# Patient Record
Sex: Female | Born: 1980 | Race: Black or African American | Hispanic: No | Marital: Single | Smoking: Never smoker
Health system: Southern US, Community
[De-identification: ages and names within clinical notes are randomized; demographics above are authoritative.]

## PROBLEM LIST (undated history)

## (undated) DIAGNOSIS — F32A Depression, unspecified: Secondary | ICD-10-CM

## (undated) DIAGNOSIS — F419 Anxiety disorder, unspecified: Secondary | ICD-10-CM

## (undated) DIAGNOSIS — I10 Essential (primary) hypertension: Secondary | ICD-10-CM

## (undated) DIAGNOSIS — F4310 Post-traumatic stress disorder, unspecified: Secondary | ICD-10-CM

## (undated) DIAGNOSIS — G4730 Sleep apnea, unspecified: Secondary | ICD-10-CM

---

## 2020-04-23 ENCOUNTER — Ambulatory Visit
Admission: EM | Admit: 2020-04-23 | Discharge: 2020-04-23 | Disposition: A | Discharge status: Home / Self Care | Payer: Medicaid Other

## 2020-04-23 ENCOUNTER — Other Ambulatory Visit: Payer: Self-pay

## 2020-04-23 DIAGNOSIS — J0110 Acute frontal sinusitis, unspecified: Secondary | ICD-10-CM | POA: Diagnosis not present

## 2020-04-23 DIAGNOSIS — Z1152 Encounter for screening for COVID-19: Secondary | ICD-10-CM

## 2020-04-23 HISTORY — DX: Post-traumatic stress disorder, unspecified: F43.10

## 2020-04-23 HISTORY — DX: Anxiety disorder, unspecified: F41.9

## 2020-04-23 HISTORY — DX: Essential (primary) hypertension: I10

## 2020-04-23 HISTORY — DX: Depression, unspecified: F32.A

## 2020-04-23 HISTORY — DX: Sleep apnea, unspecified: G47.30

## 2020-04-23 MED ORDER — CETIRIZINE HCL 10 MG PO TABS: 10.0000 mg | ORAL_TABLET | Freq: Every day | ORAL | 0 refills | Status: AC

## 2020-04-23 MED ORDER — FLUTICASONE PROPIONATE 50 MCG/ACT NA SUSP: 2.0000 | Freq: Every day | NASAL | 2 refills | Status: DC

## 2020-04-23 NOTE — ED Provider Notes (Signed)
Pamela Sullivan    CSN: [id number] Arrival date & time: 04/23/20  0836      History   Chief Complaint Chief Complaint  Patient presents with  . Facial Pain    HPI Pamela Sullivan is a 40 y.o. female.   Patient is a 40 year old female that presents today with complaints of facial pressure, facial swelling, nasal congestion, left ear itching.  This is been present for the past 3 days.  Took one dose of Claritin yesterday.  No cough, chest congestion, fever, chills.  No specific concerns for Covid.  History of allergies.     Past Medical History:  Diagnosis Date  . Anxiety   . Depression   . Hypertension   . PTSD (post-traumatic stress disorder)   . Sleep apnea     There are no problems to display for this patient.   Past Surgical History:  Procedure Laterality Date  . CESAREAN SECTION      OB History    Gravida  1   Para      Term      Preterm      AB      Living        SAB      IAB      Ectopic      Multiple      Live Births               Home Medications    Prior to Admission medications   Medication Sig Start Date End Date Taking? Authorizing Provider  albuterol (VENTOLIN HFA) 108 (90 Base) MCG/ACT inhaler Inhale into the lungs. 08/17/19  Yes [provider]  amLODipine (NORVASC) 5 MG tablet Take by mouth. 09/30/19  Yes [provider]  benzonatate (TESSALON) 200 MG capsule Take by mouth. 09/30/19  Yes [provider]  buPROPion (WELLBUTRIN XL) 150 MG 24 hr tablet Take 1 a day 01/24/20  Yes [provider]  cetirizine (ZYRTEC) 10 MG tablet Take 1 tablet (10 mg total) by mouth daily. 04/23/20  Yes Darrien Laakso A, NP  fluticasone (FLONASE) 50 MCG/ACT nasal spray Place 2 sprays into both nostrils daily. 04/23/20  Yes Angila Wombles A, NP  hydrocortisone (ANUSOL-HC) 25 MG suppository Place rectally. 11/01/18  Yes [provider]  hydrOXYzine (ATARAX/VISTARIL) 25 MG tablet Take 1 tablet by mouth  every 6 (six) hours as needed. 01/24/20  Yes [provider]  iron polysaccharides (NIFEREX) 150 MG capsule TAKE 1 CAPSULE(150 MG) BY MOUTH DAILY 07/20/19  Yes [provider]  meloxicam (MOBIC) 7.5 MG tablet Take by mouth. 04/06/20  Yes [provider]  sertraline (ZOLOFT) 100 MG tablet Take 1.5 tablets at night. 01/24/20  Yes [provider]  methocarbamol (ROBAXIN) 500 MG tablet Take 500 mg by mouth every 8 (eight) hours as needed. 04/06/20   [provider]  omeprazole (PRILOSEC) 20 MG capsule Take 20 mg by mouth daily. 03/20/20   [provider]    Family History Family History  Family history unknown: Yes    Social History Social History   Tobacco Use  . Smoking status: Never Smoker  . Smokeless tobacco: Never Used  Substance Use Topics  . Alcohol use: Never  . Drug use: Never     Allergies   Latex   Review of Systems Review of Systems   Physical Exam Triage Vital Signs ED Triage Vitals [04/23/20 0851]  Enc Vitals Group     BP (!) 136/97  Pulse Rate 77     Resp 18     Temp 98.4 F (36.9 C)     Temp Source Oral     SpO2 97 %     Weight      Height      Head Circumference      Peak Flow      Pain Score      Pain Loc      Pain Edu?      Excl. in GC?    No data found.  Updated Vital Signs BP (!) 136/97 (BP Location: Left Arm)   Pulse 77   Temp 98.4 F (36.9 C) (Oral)   Resp 18   LMP 04/21/2020   SpO2 97%   Breastfeeding Unknown   Visual Acuity Right Eye Distance:   Left Eye Distance:   Bilateral Distance:    Right Eye Near:   Left Eye Near:    Bilateral Near:     Physical Exam Vitals and nursing note reviewed.  Constitutional:      General: She is not in acute distress.    Appearance: Normal appearance. She is not ill-appearing, toxic-appearing or diaphoretic.  HENT:     Head: Normocephalic.     Right Ear: Tympanic membrane and ear canal normal.     Left Ear: Tympanic membrane and  ear canal normal.     Nose: Congestion present.     Mouth/Throat:     Pharynx: Oropharynx is clear.  Eyes:     Conjunctiva/sclera: Conjunctivae normal.  Pulmonary:     Effort: Pulmonary effort is normal.  Musculoskeletal:        General: Normal range of motion.     Cervical back: Normal range of motion.  Skin:    General: Skin is warm and dry.     Findings: No rash.  Neurological:     Mental Status: She is alert.  Psychiatric:        Mood and Affect: Mood normal.      UC Treatments / Results  Labs (all labs ordered are listed, but only abnormal results are displayed) Labs Reviewed  NOVEL CORONAVIRUS, NAA    EKG   Radiology No results found.  Procedures Procedures (including critical care time)  Medications Ordered in UC Medications - No data to display  Initial Impression / Assessment and Plan / UC Course  I have reviewed the triage vital signs and the nursing notes.  Pertinent labs & imaging results that were available during my care of the patient were reviewed by me and considered in my medical decision making (see chart for details).     Sinusitis 3 days of symptoms.  Treating with Flonase and Zyrtec daily.  Recommend Mucinex over-the-counter. Follow up as needed for continued or worsening symptoms  Final Clinical Impressions(s) / UC Diagnoses   Final diagnoses:  Acute non-recurrent frontal sinusitis     Discharge Instructions     Flonase and Zyrtec daily.  Recommend Mucinex over-the-counter Follow up as needed for continued or worsening symptoms     ED Prescriptions    Medication Sig Dispense Auth. Provider   fluticasone (FLONASE) 50 MCG/ACT nasal spray Place 2 sprays into both nostrils daily. 16 g Javeah Loeza A, NP   cetirizine (ZYRTEC) 10 MG tablet Take 1 tablet (10 mg total) by mouth daily. 30 tablet Dahlia Byes A, NP     PDMP not reviewed this encounter.   Janace Aris, NP 04/23/20 [phone]

## 2020-04-23 NOTE — ED Triage Notes (Signed)
Patient presents to Urgent Care with complaints of facial pressure, congestion, and left ear irritation x 3 days. Treating with OTC Claritin.   Denies fever, n/v, or diarrhea.

## 2020-04-23 NOTE — Discharge Instructions (Signed)
Flonase and Zyrtec daily.  Recommend Mucinex over-the-counter Follow up as needed for continued or worsening symptoms

## 2020-04-24 LAB — SARS-COV-2, NAA 2 DAY TAT

## 2020-04-24 LAB — NOVEL CORONAVIRUS, NAA: SARS-CoV-2, NAA: NOT DETECTED

## 2020-10-06 ENCOUNTER — Other Ambulatory Visit: Payer: Self-pay

## 2020-10-06 ENCOUNTER — Ambulatory Visit
Admission: EM | Admit: 2020-10-06 | Discharge: 2020-10-06 | Disposition: A | Discharge status: Home / Self Care | Payer: Medicaid Other | Attending: Family Medicine | Admitting: Family Medicine

## 2020-10-06 DIAGNOSIS — M62830 Muscle spasm of back: Secondary | ICD-10-CM

## 2020-10-06 MED ORDER — KETOROLAC TROMETHAMINE 60 MG/2ML IM SOLN: 60.0000 mg | Freq: Once | INTRAMUSCULAR | Status: DC

## 2020-10-06 MED ORDER — CYCLOBENZAPRINE HCL 10 MG PO TABS: ORAL_TABLET | ORAL | 0 refills | Status: AC

## 2020-10-06 MED ORDER — MELOXICAM 15 MG PO TABS: 15.0000 mg | ORAL_TABLET | Freq: Every day | ORAL | 0 refills | Status: AC

## 2020-10-06 NOTE — ED Triage Notes (Signed)
Patient presents to Urgent Care with complaints of left upper back pain x 3 days. She states only change in activity was 3 days ago when she braided her hair and had her arms raised up for long periods of time. Treating pain with tylenol and lidocaine patches with little relief.

## 2020-10-06 NOTE — ED Provider Notes (Signed)
[facility]   [id number] 10/06/20 Arrival Time: 0843  ASSESSMENT & PLAN:  1. Muscle spasm of back    Able to ambulate here and hemodynamically stable. No indication for imaging of back at this time given no trauma and normal neurological exam. Discussed.  Meds ordered this encounter  Medications   ketorolac (TORADOL) injection 60 mg   cyclobenzaprine (FLEXERIL) 10 MG tablet    Sig: Take 1 tablet by mouth 3 times daily as needed for muscle spasm. Warning: May cause drowsiness.    Dispense:  21 tablet    Refill:  0   meloxicam (MOBIC) 15 MG tablet    Sig: Take 1 tablet (15 mg total) by mouth daily.    Dispense:  7 tablet    Refill:  0    Medication sedation precautions given. Encourage ROM/movement as tolerated.  Recommend:  Follow-up Information     [facility] at [facility] .   Specialty: Urgent Care Why: If worsening or failing to improve as anticipated. Contact information: [address], [address] [place] [id number] [place] [address] [phone]                Reviewed expectations re: course of current medical issues. Questions answered. Outlined signs and symptoms indicating need for more acute intervention. Patient verbalized understanding. After Visit Summary given.   SUBJECTIVE: History from: patient.  Pamela Sullivan is a 40 y.o. female who presents with complaint of fairly persistent left sided lower back discomfort. Onset gradual. First noted  2-3 d ago . Injury/trama: no. Pain noted after doing her hair for several hours. History of back problems requiring medical care: rare. Pain described as aching and without radiation. Occasional sharp pain. Aggravating factors: certain movements. Alleviating factors: have not been identified. Progressive LE weakness or saddle anesthesia: none. Extremity sensation changes or weakness: none. Ambulatory without difficulty. Normal bowel/bladder habits: yes;  without urinary retention. Normal PO intake without n/v. No associated abdominal pain/n/v. Self treatment: has NSAID, with no relief. Reports no chronic steroid use, fevers, IV drug use, or recent back surgeries or procedures.   OBJECTIVE:  Vitals:   10/06/20 0854  BP: 134/84  Pulse: 82  Resp: 18  Temp: 98.8 F (37.1 C)  TempSrc: Oral  SpO2: 95%    General appearance: alert; no distress HEENT: [place]; AT Neck: supple with FROM; without midline tenderness CV: regular Lungs: unlabored respirations; speaks full sentences without difficulty Abdomen: soft, non-tender; non-distended Back: moderate and poorly localized tenderness to palpation over L trapezius distribution ; FROM at waist; bruising: none; without midline tenderness Extremities: without edema; symmetrical without gross deformities; normal ROM of bilateral LE Skin: warm and dry Neurologic: normal gait; normal sensation and strength of bilateral UE Psychological: alert and cooperative; normal mood and affect  Allergies  Allergen Reactions   Latex Other (See Comments)    Latex - gloves with the powder causes a rash on hands     Past Medical History:  Diagnosis Date   Anxiety    Depression    Hypertension    PTSD (post-traumatic stress disorder)    Sleep apnea    Social History   Socioeconomic History   Marital status: Single    Spouse name: Not on file   Number of children: Not on file   Years of education: Not on file   Highest education level: Not on file  Occupational History   Not on file  Tobacco Use   Smoking status: Never  Smokeless tobacco: Never  Substance and Sexual Activity   Alcohol use: Never   Drug use: Never   Sexual activity: Not on file  Other Topics Concern   Not on file  Social History Narrative   Not on file   Social Determinants of Health   Financial Resource Strain: Not on file  Food Insecurity: Not on file  Transportation Needs: Not on file  Physical Activity: Not on file   Stress: Not on file  Social Connections: Not on file  Intimate Partner Violence: Not on file   Family History  Family history unknown: Yes   Past Surgical History:  Procedure Laterality Date   CESAREAN SECTION        Mardella Layman, MD 10/06/20 [phone]

## 2020-10-06 NOTE — Discharge Instructions (Addendum)
Meds ordered this encounter  Medications   ketorolac (TORADOL) injection 60 mg   cyclobenzaprine (FLEXERIL) 10 MG tablet    Sig: Take 1 tablet by mouth 3 times daily as needed for muscle spasm. Warning: May cause drowsiness.    Dispense:  21 tablet    Refill:  0   meloxicam (MOBIC) 15 MG tablet    Sig: Take 1 tablet (15 mg total) by mouth daily.    Dispense:  7 tablet    Refill:  0

## 2021-02-12 ENCOUNTER — Encounter: Payer: Self-pay | Admitting: Emergency Medicine

## 2021-02-12 ENCOUNTER — Ambulatory Visit
Admission: EM | Admit: 2021-02-12 | Discharge: 2021-02-12 | Disposition: A | Discharge status: Home / Self Care | Payer: Medicaid Other | Attending: Family Medicine | Admitting: Family Medicine

## 2021-02-12 DIAGNOSIS — Z1152 Encounter for screening for COVID-19: Secondary | ICD-10-CM | POA: Diagnosis not present

## 2021-02-12 DIAGNOSIS — R509 Fever, unspecified: Secondary | ICD-10-CM

## 2021-02-12 MED ORDER — FLUTICASONE PROPIONATE 50 MCG/ACT NA SUSP: 2.0000 | Freq: Every day | NASAL | 0 refills | Status: AC

## 2021-02-12 MED ORDER — BENZONATATE 100 MG PO CAPS: 200.0000 mg | ORAL_CAPSULE | Freq: Three times a day (TID) | ORAL | 0 refills | Status: DC | PRN

## 2021-02-12 NOTE — Discharge Instructions (Signed)
Your COVID 19 results should result within 3 up to 5 days. Negative results are immediately resulted to Mychart. Positive results will receive a follow-up call from our clinic. If symptoms are present, I recommend home quarantine until results are known.  Alternate Tylenol and ibuprofen as needed for body aches and fever.  Symptom management per recommendations discussed today.  If any breathing difficulty or chest pain develops go immediately to the closest emergency department for evaluation.

## 2021-02-12 NOTE — ED Triage Notes (Addendum)
PT c/o scratchy throat, fever, cough, and HA sxs yesterday

## 2021-02-12 NOTE — ED Provider Notes (Signed)
Pamela Sullivan    CSN: [id number] Arrival date & time: 02/12/21  1603      History   Chief Complaint Chief Complaint  Patient presents with   Cough   Sore Throat   Fever    HPI Pamela Sullivan is a 40 y.o. female.   HPI Patient with a history of sleep apnea, hypertension, obesity presents today with cough, sore throat, and fever. Took a home COVID test and reports result was inclusive as a second line appeared which was very faint. 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 She had a fever earlier today which was 101.3. She took one dose of ibuprofen and continues to have a low grade fever of 99.2. Endorses headache and sinus pressure as her more worrisome symptom. Denies SOB or wheezing.   Past Medical History:  Diagnosis Date   Anxiety    Depression    Hypertension    PTSD (post-traumatic stress disorder)    Sleep apnea     There are no problems to display for this patient.   Past Surgical History:  Procedure Laterality Date   CESAREAN SECTION      OB History     Gravida  1   Para      Term      Preterm      AB      Living         SAB      IAB      Ectopic      Multiple      Live Births               Home Medications    Prior to Admission medications   Medication Sig Start Date End Date Taking? Authorizing Provider  amLODipine (NORVASC) 5 MG tablet Take by mouth. 09/30/19  Yes [provider]  benzonatate (TESSALON) 100 MG capsule Take 2 capsules (200 mg total) by mouth 3 (three) times daily as needed for cough. 02/12/21  Yes Bing Neighbors, FNP  buPROPion (WELLBUTRIN XL) 150 MG 24 hr tablet Take 1 a day 01/24/20  Yes [provider]  cetirizine (ZYRTEC) 10 MG tablet Take 1 tablet (10 mg total) by mouth daily. 04/23/20  Yes Bast, Traci A, NP  fluticasone (FLONASE) 50 MCG/ACT nasal spray Place 2 sprays into both nostrils daily. 02/12/21  Yes Bing Neighbors, FNP   hydrOXYzine (ATARAX/VISTARIL) 25 MG tablet Take 1 tablet by mouth every 6 (six) hours as needed. 01/24/20  Yes [provider]  iron polysaccharides (NIFEREX) 150 MG capsule TAKE 1 CAPSULE(150 MG) BY MOUTH DAILY 07/20/19  Yes [provider]  levocetirizine (XYZAL) 5 MG tablet Take 1 tablet by mouth every evening. 01/17/19  Yes [provider]  omeprazole (PRILOSEC) 20 MG capsule Take 20 mg by mouth daily. 03/20/20  Yes [provider]  sertraline (ZOLOFT) 100 MG tablet Take 1.5 tablets at night. 01/24/20  Yes [provider]  traZODone (DESYREL) 50 MG tablet Take 50-100 mg by mouth at bedtime. 09/25/20  Yes [provider]  albuterol (VENTOLIN HFA) 108 (90 Base) MCG/ACT inhaler Inhale into the lungs. 08/17/19   [provider]  amLODipine (NORVASC) 5 MG tablet Take 1 tablet by mouth daily. 06/14/20   [provider]  buPROPion (WELLBUTRIN XL) 300 MG 24 hr tablet Take 300 mg by mouth daily. 08/21/20   [provider]  cyclobenzaprine (FLEXERIL) 10 MG  tablet Take 1 tablet by mouth 3 times daily as needed for muscle spasm. Warning: May cause drowsiness. 10/06/20   Mardella Layman, MD  diazepam (VALIUM) 5 MG tablet Take 1 tablet 1 hour prior to MRI, may take 2nd tablet if needed 20 minutes prior to MRI 07/04/20   [provider]  hydrocortisone (ANUSOL-HC) 25 MG suppository Place rectally. 11/01/18   [provider]  meloxicam (MOBIC) 15 MG tablet Take 1 tablet (15 mg total) by mouth daily. 10/06/20   Mardella Layman, MD  Misc. Devices (RECONSTITUBE) MISC Use as directed to check blood pressure daily at the same time everyday. Please dispense 1 blood pressure cuff. ICD I10.0. [id number] 11/02/18   [provider]    Family History Family History  Family history unknown: Yes    Social History Social History   Tobacco Use   Smoking status: Never   Smokeless tobacco: Never  Vaping Use   Vaping Use: Never  used  Substance Use Topics   Alcohol use: Never   Drug use: Never     Allergies   Latex   Review of Systems Review of Systems Pertinent negatives listed in HPI   Physical Exam Triage Vital Signs ED Triage Vitals [02/12/21 1649]  Enc Vitals Group     BP      Pulse      Resp      Temp      Temp src      SpO2      Weight      Height      Head Circumference      Peak Flow      Pain Score 6     Pain Loc      Pain Edu?      Excl. in GC?    No data found.  Updated Vital Signs BP (!) 140/92 (BP Location: Left Arm)   Pulse (!) 115   Temp 99.2 F (37.3 C) (Oral)   Resp 18   LMP  (LMP Unknown)   SpO2 98%   Visual Acuity Right Eye Distance:   Left Eye Distance:   Bilateral Distance:    Right Eye Near:   Left Eye Near:    Bilateral Near:     Physical Exam  General Appearance:    Alert, cooperative, no distress  HENT:   Normocephalic, ears normal, nares mucosal edema with congestion, rhinorrhea, oropharynx patient w/o erythema or edema  Eyes:    PERRL, conjunctiva/corneas clear, EOM's intact       Lungs:     Clear to auscultation bilaterally, respirations unlabored  Heart:    Regular rate and rhythm  Neurologic:   Awake, alert, oriented x 3. No apparent focal neurological           defect.      UC Treatments / Results  Labs (all labs ordered are listed, but only abnormal results are displayed) Labs Reviewed  COVID-19, FLU A+B NAA    EKG   Radiology No results found.  Procedures Procedures (including critical care time)  Medications Ordered in UC Medications - No data to display  Initial Impression / Assessment and Plan / UC Course  I have reviewed the triage vital signs and the nursing notes.  Pertinent labs & imaging results that were available during my care of the patient were reviewed by me and considered in my medical decision making (see chart for details).    Febrile respiratory illness suspicious for COVID. COVID/Flu test pending.  Symptom management warranted only.  Manage fever with Tylenol and ibuprofen.  Nasal symptoms with over-the-counter antihistamines recommended.  Treatment per discharge medications/discharge instructions.  Red flags/ER precautions given. The most current CDC isolation/quarantine recommendation advised.   Final Clinical Impressions(s) / UC Diagnoses   Final diagnoses:  Encounter for screening for COVID-19  Febrile respiratory illness     Discharge Instructions      Your COVID 19 results should result within 3 up to 5 days. Negative results are immediately resulted to Mychart. Positive results will receive a follow-up call from our clinic. If symptoms are present, I recommend home quarantine until results are known.  Alternate Tylenol and ibuprofen as needed for body aches and fever.  Symptom management per recommendations discussed today.  If any breathing difficulty or chest pain develops go immediately to the closest emergency department for evaluation.      ED Prescriptions     Medication Sig Dispense Auth. Provider   fluticasone (FLONASE) 50 MCG/ACT nasal spray Place 2 sprays into both nostrils daily. 16 g Bing Neighbors, FNP   benzonatate (TESSALON) 100 MG capsule Take 2 capsules (200 mg total) by mouth 3 (three) times daily as needed for cough. 40 capsule Bing Neighbors, FNP      PDMP not reviewed this encounter.   Bing Neighbors, FNP 02/12/21 1718

## 2021-02-14 LAB — COVID-19, FLU A+B NAA
Influenza A, NAA: NOT DETECTED
Influenza B, NAA: NOT DETECTED
SARS-CoV-2, NAA: DETECTED — AB

## 2021-04-18 ENCOUNTER — Ambulatory Visit
Admission: EM | Admit: 2021-04-18 | Discharge: 2021-04-18 | Disposition: A | Discharge status: Home / Self Care | Payer: Medicaid Other | Attending: Physician Assistant | Admitting: Physician Assistant

## 2021-04-18 ENCOUNTER — Encounter: Payer: Self-pay | Admitting: Emergency Medicine

## 2021-04-18 ENCOUNTER — Other Ambulatory Visit: Payer: Self-pay

## 2021-04-18 ENCOUNTER — Ambulatory Visit (INDEPENDENT_AMBULATORY_CARE_PROVIDER_SITE_OTHER): Payer: Medicaid Other

## 2021-04-18 DIAGNOSIS — S92515A Nondisplaced fracture of proximal phalanx of left lesser toe(s), initial encounter for closed fracture: Secondary | ICD-10-CM

## 2021-04-18 MED ORDER — IBUPROFEN 800 MG PO TABS: 800.0000 mg | ORAL_TABLET | Freq: Three times a day (TID) | ORAL | 0 refills | Status: AC | PRN

## 2021-04-18 NOTE — Discharge Instructions (Signed)
Return if any problems.

## 2021-04-18 NOTE — ED Triage Notes (Signed)
Pt fell down her steps at home and injured her left pinky toe.

## 2021-04-18 NOTE — ED Provider Notes (Signed)
Roderic Palau    CSN: [id number] Arrival date & time: 04/18/21  [id number]      History   Chief Complaint Chief Complaint  Patient presents with   Toe Injury    HPI Pamela Sullivan is a 41 y.o. female.   Pt reports she injure her toe walking down a step  The history is provided by the patient. No language interpreter was used.  Toe Pain This is a new problem. The current episode started yesterday. The problem occurs constantly. The problem has not changed since onset.Nothing aggravates the symptoms. Nothing relieves the symptoms. She has tried nothing for the symptoms. The treatment provided no relief.   Past Medical History:  Diagnosis Date   Anxiety    Depression    Hypertension    PTSD (post-traumatic stress disorder)    Sleep apnea     There are no problems to display for this patient.   Past Surgical History:  Procedure Laterality Date   CESAREAN SECTION      OB History     Gravida  1   Para      Term      Preterm      AB      Living         SAB      IAB      Ectopic      Multiple      Live Births               Home Medications    Prior to Admission medications   Medication Sig Start Date End Date Taking? Authorizing Provider  albuterol (VENTOLIN HFA) 108 (90 Base) MCG/ACT inhaler Inhale into the lungs. 08/17/19  Yes [provider]  amLODipine (NORVASC) 5 MG tablet Take by mouth. 09/30/19  Yes [provider]  buPROPion (WELLBUTRIN XL) 150 MG 24 hr tablet Take 1 a day 01/24/20  Yes [provider]  cetirizine (ZYRTEC) 10 MG tablet Take 1 tablet (10 mg total) by mouth daily. 04/23/20  Yes Bast, Traci A, NP  diazepam (VALIUM) 5 MG tablet Take 1 tablet 1 hour prior to MRI, may take 2nd tablet if needed 20 minutes prior to MRI 07/04/20  Yes [provider]  fluticasone (FLONASE) 50 MCG/ACT nasal spray Place 2 sprays into both nostrils daily. 02/12/21  Yes Scot Jun, FNP  hydrOXYzine  (ATARAX/VISTARIL) 25 MG tablet Take 1 tablet by mouth every 6 (six) hours as needed. 01/24/20  Yes [provider]  ibuprofen (ADVIL) 800 MG tablet Take 1 tablet (800 mg total) by mouth every 8 (eight) hours as needed. 04/18/21  Yes Caryl Ada K, PA-C  iron polysaccharides (NIFEREX) 150 MG capsule TAKE 1 CAPSULE(150 MG) BY MOUTH DAILY 07/20/19  Yes [provider]  levocetirizine (XYZAL) 5 MG tablet Take 1 tablet by mouth every evening. 01/17/19  Yes [provider]  omeprazole (PRILOSEC) 20 MG capsule Take 20 mg by mouth daily. 03/20/20  Yes [provider]  sertraline (ZOLOFT) 100 MG tablet Take 1.5 tablets at night. 01/24/20  Yes [provider]  traZODone (DESYREL) 50 MG tablet Take 50-100 mg by mouth at bedtime. 09/25/20  Yes [provider]  amLODipine (NORVASC) 5 MG tablet Take 1 tablet by mouth daily. 06/14/20   [provider]  benzonatate (TESSALON) 100 MG capsule Take 2 capsules (200 mg total) by mouth 3 (three) times daily as needed for cough. 02/12/21   Scot Jun, FNP  buPROPion (  WELLBUTRIN XL) 300 MG 24 hr tablet Take 300 mg by mouth daily. 08/21/20   [provider]  cyclobenzaprine (FLEXERIL) 10 MG tablet Take 1 tablet by mouth 3 times daily as needed for muscle spasm. Warning: May cause drowsiness. 10/06/20   Vanessa Kick, MD  hydrocortisone (ANUSOL-HC) 25 MG suppository Place rectally. 11/01/18   [provider]  meloxicam (MOBIC) 15 MG tablet Take 1 tablet (15 mg total) by mouth daily. 10/06/20   Vanessa Kick, MD  Misc. Devices (RECONSTITUBE) MISC Use as directed to check blood pressure daily at the same time everyday. Please dispense 1 blood pressure cuff. ICD I10.0. [id number] 11/02/18   [provider]    Family History Family History  Family history unknown: Yes    Social History Social History   Tobacco Use   Smoking status: Never   Smokeless tobacco: Never  Vaping Use    Vaping Use: Never used  Substance Use Topics   Alcohol use: Never   Drug use: Never     Allergies   Latex   Review of Systems Review of Systems  All other systems reviewed and are negative.   Physical Exam Triage Vital Signs ED Triage Vitals  Enc Vitals Group     BP 04/18/21 0936 (!) 132/91     Pulse Rate 04/18/21 0936 80     Resp 04/18/21 0936 18     Temp 04/18/21 0936 99.2 F (37.3 C)     Temp Source 04/18/21 0936 Oral     SpO2 04/18/21 0936 97 %     Weight --      Height --      Head Circumference --      Peak Flow --      Pain Score 04/18/21 0933 8     Pain Loc --      Pain Edu? --      Excl. in [place]? --    No data found.  Updated Vital Signs BP (!) 132/91 (BP Location: Left Arm)    Pulse 80    Temp 99.2 F (37.3 C) (Oral)    Resp 18    LMP 03/23/2021    SpO2 97%   Visual Acuity Right Eye Distance:   Left Eye Distance:   Bilateral Distance:    Right Eye Near:   Left Eye Near:    Bilateral Near:     Physical Exam Vitals reviewed.  Constitutional:      Appearance: Normal appearance.  Cardiovascular:     Rate and Rhythm: Normal rate.  Pulmonary:     Effort: Pulmonary effort is normal.  Musculoskeletal:        General: Swelling and tenderness present.     Comments: Ender ;eft 5th toe, nv and ns intact  Skin:    General: Skin is warm.  Neurological:     General: No focal deficit present.     Mental Status: She is alert.  Psychiatric:        Mood and Affect: Mood normal.     UC Treatments / Results  Labs (all labs ordered are listed, but only abnormal results are displayed) Labs Reviewed - No data to display  EKG   Radiology No results found.  Procedures Procedures (including critical care time)  Medications Ordered in UC Medications - No data to display  Initial Impression / Assessment and Plan / UC Course  I have reviewed the triage vital signs and the nursing notes.  Pertinent labs & imaging results that  were available during  my care of the patient were reviewed by me and considered in my medical decision making (see chart for details).     MDM:  Xray shows fracure 5th proximal phalanx left foot Final Clinical Impressions(s) / UC Diagnoses   Final diagnoses:  Closed nondisplaced fracture of proximal phalanx of lesser toe of left foot, initial encounter     Discharge Instructions      Return if any problems.   ED Prescriptions     Medication Sig Dispense Auth. Provider   ibuprofen (ADVIL) 800 MG tablet Take 1 tablet (800 mg total) by mouth every 8 (eight) hours as needed. 30 tablet Fransico Meadow, [place]      PDMP not reviewed this encounter. An After Visit Summary was printed and given to the patient.    Fransico Meadow, [place] 04/18/21 1020

## 2021-10-01 ENCOUNTER — Ambulatory Visit
Admission: EM | Admit: 2021-10-01 | Discharge: 2021-10-01 | Disposition: A | Discharge status: Home / Self Care | Payer: Medicaid Other | Attending: Emergency Medicine | Admitting: Emergency Medicine

## 2021-10-01 DIAGNOSIS — B349 Viral infection, unspecified: Secondary | ICD-10-CM

## 2021-10-01 LAB — POCT RAPID STREP A (OFFICE): Rapid Strep A Screen: NEGATIVE

## 2021-10-01 MED ORDER — BENZONATATE 100 MG PO CAPS: 100.0000 mg | ORAL_CAPSULE | Freq: Three times a day (TID) | ORAL | 0 refills | Status: AC | PRN

## 2021-10-01 MED ORDER — VENTOLIN HFA 108 (90 BASE) MCG/ACT IN AERS: 1.0000 | INHALATION_SPRAY | RESPIRATORY_TRACT | 0 refills | Status: AC | PRN

## 2021-10-01 NOTE — ED Triage Notes (Signed)
Pt here with productive cough, bilateral otalgia, sore throat, and nasal congestion x 3 days.

## 2021-10-01 NOTE — ED Provider Notes (Signed)
Pamela Sullivan    CSN: [id number] Arrival date & time: 10/01/21  0901      History   Chief Complaint Chief Complaint  Patient presents with   Otalgia   Cough   Nasal Congestion   Sore Throat    HPI Pamela Sullivan is a 41 y.o. female.  Patient presents with 3-day history of ear pain, sore throat, congestion, productive cough.  No fever, chills, rash, shortness of breath, chest pain, vomiting, diarrhea, or other symptoms.  Treatment at home with Claritin.  Her medical history includes hypertension, sleep apnea, PTSD, anxiety, depression.  The history is provided by the patient and medical records.    Past Medical History:  Diagnosis Date   Anxiety    Depression    Hypertension    PTSD (post-traumatic stress disorder)    Sleep apnea     There are no problems to display for this patient.   Past Surgical History:  Procedure Laterality Date   CESAREAN SECTION      OB History     Gravida  1   Para      Term      Preterm      AB      Living         SAB      IAB      Ectopic      Multiple      Live Births               Home Medications    Prior to Admission medications   Medication Sig Start Date End Date Taking? Authorizing Provider  albuterol (VENTOLIN HFA) 108 (90 Base) MCG/ACT inhaler Inhale 1-2 puffs into the lungs every 4 (four) hours as needed for wheezing or shortness of breath. 10/01/21  Yes Mickie Bail, NP  benzonatate (TESSALON) 100 MG capsule Take 1 capsule (100 mg total) by mouth 3 (three) times daily as needed for cough. 10/01/21  Yes Mickie Bail, NP  amLODipine (NORVASC) 5 MG tablet Take by mouth. 09/30/19   [provider]  amLODipine (NORVASC) 5 MG tablet Take 1 tablet by mouth daily. 06/14/20   [provider]  buPROPion (WELLBUTRIN XL) 150 MG 24 hr tablet Take 1 a day 01/24/20   [provider]  buPROPion (WELLBUTRIN XL) 300 MG 24 hr tablet Take 300 mg by mouth daily. 08/21/20   [provider]  cetirizine (ZYRTEC) 10 MG tablet Take 1 tablet (10 mg total) by mouth daily. 04/23/20   Dahlia Byes A, NP  cyclobenzaprine (FLEXERIL) 10 MG tablet Take 1 tablet by mouth 3 times daily as needed for muscle spasm. Warning: May cause drowsiness. 10/06/20   Mardella Layman, MD  diazepam (VALIUM) 5 MG tablet Take 1 tablet 1 hour prior to MRI, may take 2nd tablet if needed 20 minutes prior to MRI 07/04/20   [provider]  fluticasone (FLONASE) 50 MCG/ACT nasal spray Place 2 sprays into both nostrils daily. 02/12/21   Bing Neighbors, FNP  hydrocortisone (ANUSOL-HC) 25 MG suppository Place rectally. 11/01/18   [provider]  hydrOXYzine (ATARAX/VISTARIL) 25 MG tablet Take 1 tablet by mouth every 6 (six) hours as needed. 01/24/20   [provider]  ibuprofen (ADVIL) 800 MG tablet Take 1 tablet (800 mg total) by mouth every 8 (eight) hours as needed. 04/18/21   Elson Areas, PA-C  iron polysaccharides (NIFEREX) 150 MG capsule TAKE 1 CAPSULE(150 MG) BY MOUTH DAILY 07/20/19  [provider]  levocetirizine (XYZAL) 5 MG tablet Take 1 tablet by mouth every evening. 01/17/19   [provider]  meloxicam (MOBIC) 15 MG tablet Take 1 tablet (15 mg total) by mouth daily. 10/06/20   Mardella Layman, MD  Misc. Devices (RECONSTITUBE) MISC Use as directed to check blood pressure daily at the same time everyday. Please dispense 1 blood pressure cuff. ICD I10.0. [id number] 11/02/18   [provider]  omeprazole (PRILOSEC) 20 MG capsule Take 20 mg by mouth daily. 03/20/20   [provider]  sertraline (ZOLOFT) 100 MG tablet Take 1.5 tablets at night. 01/24/20   [provider]  traZODone (DESYREL) 50 MG tablet Take 50-100 mg by mouth at bedtime. 09/25/20   [provider]    Family History Family History  Family history unknown: Yes    Social History Social History   Tobacco Use   Smoking status: Never   Smokeless tobacco:  Never  Vaping Use   Vaping Use: Never used  Substance Use Topics   Alcohol use: Never   Drug use: Never     Allergies   Latex   Review of Systems Review of Systems  Constitutional:  Negative for chills and fever.  HENT:  Positive for congestion, ear pain and sore throat.   Eyes:  Negative for pain and visual disturbance.  Respiratory:  Positive for cough. Negative for shortness of breath and wheezing.   Cardiovascular:  Negative for chest pain and palpitations.  Gastrointestinal:  Negative for abdominal pain, diarrhea, nausea and vomiting.  Skin:  Negative for color change and rash.  All other systems reviewed and are negative.    Physical Exam Triage Vital Signs ED Triage Vitals  Enc Vitals Group     BP      Pulse      Resp      Temp      Temp src      SpO2      Weight      Height      Head Circumference      Peak Flow      Pain Score      Pain Loc      Pain Edu?      Excl. in [place]?    No data found.  Updated Vital Signs BP 130/82   Pulse 96   Temp 98.8 F (37.1 C)   Resp 18   SpO2 98%   Visual Acuity Right Eye Distance:   Left Eye Distance:   Bilateral Distance:    Right Eye Near:   Left Eye Near:    Bilateral Near:     Physical Exam Vitals and nursing note reviewed.  Constitutional:      General: She is not in acute distress.    Appearance: Normal appearance. She is well-developed. She is not ill-appearing.  HENT:     Right Ear: Tympanic membrane normal.     Left Ear: Tympanic membrane normal.     Nose: Rhinorrhea present.     Mouth/Throat:     Mouth: Mucous membranes are moist.     Pharynx: Oropharynx is clear.  Cardiovascular:     Rate and Rhythm: Normal rate and regular rhythm.     Heart sounds: Normal heart sounds.  Pulmonary:     Effort: Pulmonary effort is normal. No respiratory distress.     Breath sounds: Normal breath sounds. No wheezing.  Musculoskeletal:     Cervical back: Neck supple.  Skin:  General: Skin is warm and  dry.  Neurological:     Mental Status: She is alert.  Psychiatric:        Mood and Affect: Mood normal.        Behavior: Behavior normal.      UC Treatments / Results  Labs (all labs ordered are listed, but only abnormal results are displayed) Labs Reviewed  POCT RAPID STREP A (OFFICE) - Normal    EKG   Radiology No results found.  Procedures Procedures (including critical care time)  Medications Ordered in UC Medications - No data to display  Initial Impression / Assessment and Plan / UC Course  I have reviewed the triage vital signs and the nursing notes.  Pertinent labs & imaging results that were available during my care of the patient were reviewed by me and considered in my medical decision making (see chart for details).    Viral illness.  Rapid strep negative.  Patient declines COVID test.  Treating with Tessalon Perles and albuterol inhaler (patient states she has used albuterol inhaler in the past and needs a new one). Instructed patient to follow up with her PCP if her symptoms are not improving.  Education provided on viral illness.  She agrees to plan of care.    Final Clinical Impressions(s) / UC Diagnoses   Final diagnoses:  Viral illness     Discharge Instructions      Take the Tessalon Perles and use the albuterol inhaler as directed.  Follow up with your primary care provider if your symptoms are not improving.        ED Prescriptions     Medication Sig Dispense Auth. Provider   albuterol (VENTOLIN HFA) 108 (90 Base) MCG/ACT inhaler Inhale 1-2 puffs into the lungs every 4 (four) hours as needed for wheezing or shortness of breath. 54 g Mickie Bail, NP   benzonatate (TESSALON) 100 MG capsule Take 1 capsule (100 mg total) by mouth 3 (three) times daily as needed for cough. 21 capsule Mickie Bail, NP      PDMP not reviewed this encounter.   Mickie Bail, NP 10/01/21 [phone]

## 2021-10-01 NOTE — Discharge Instructions (Addendum)
Take the [facility] and use the albuterol inhaler as directed.  Follow up with your primary care provider if your symptoms are not improving.

## 2022-03-02 IMAGING — DX DG FOOT COMPLETE 3+V*L*
3 series · 3 of 3 positions shown · non-contrast
Comparison: None.

CLINICAL DATA: Left foot pain after falling down stairs yesterday.

EXAM:
LEFT FOOT - COMPLETE 3+ VIEW

[foot ap]
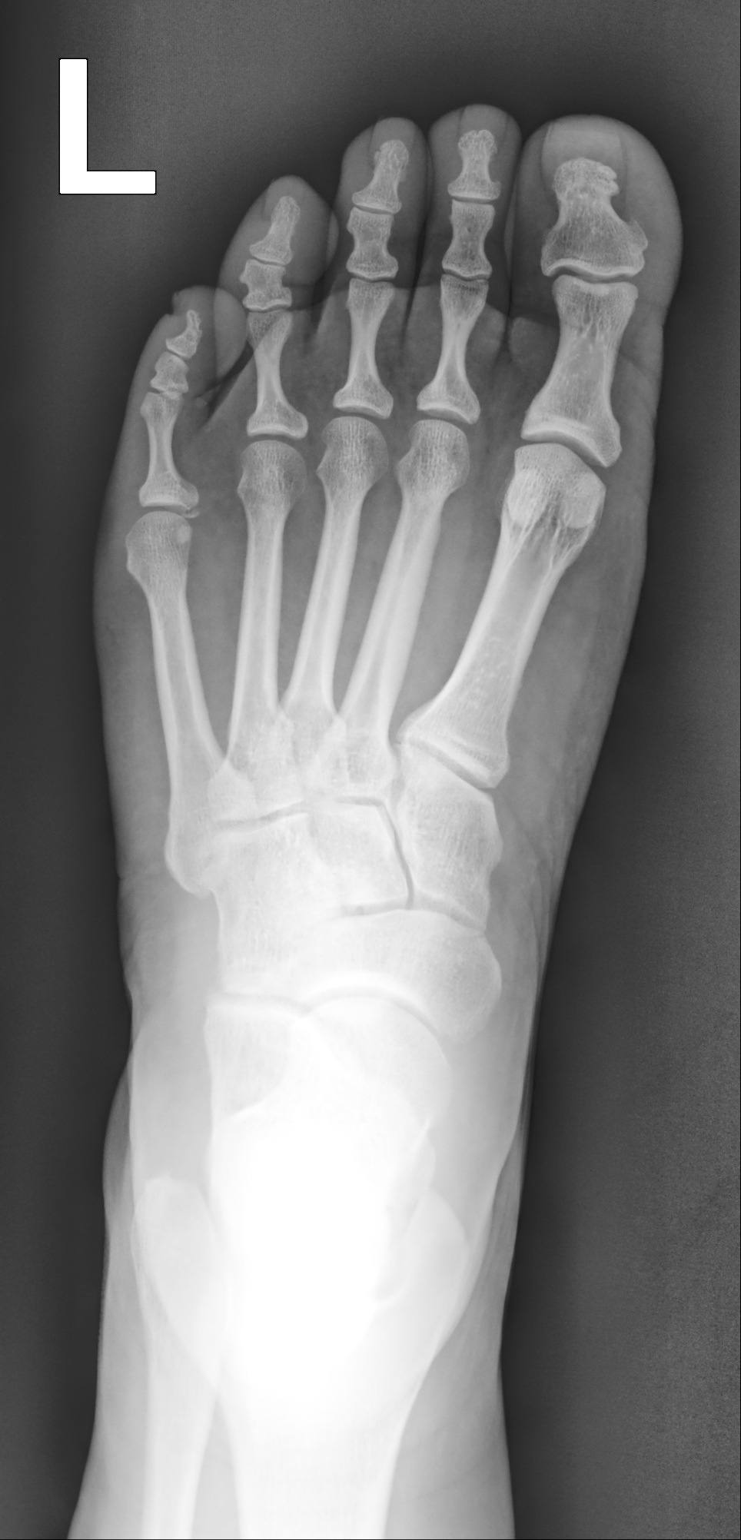

[foot mlo]
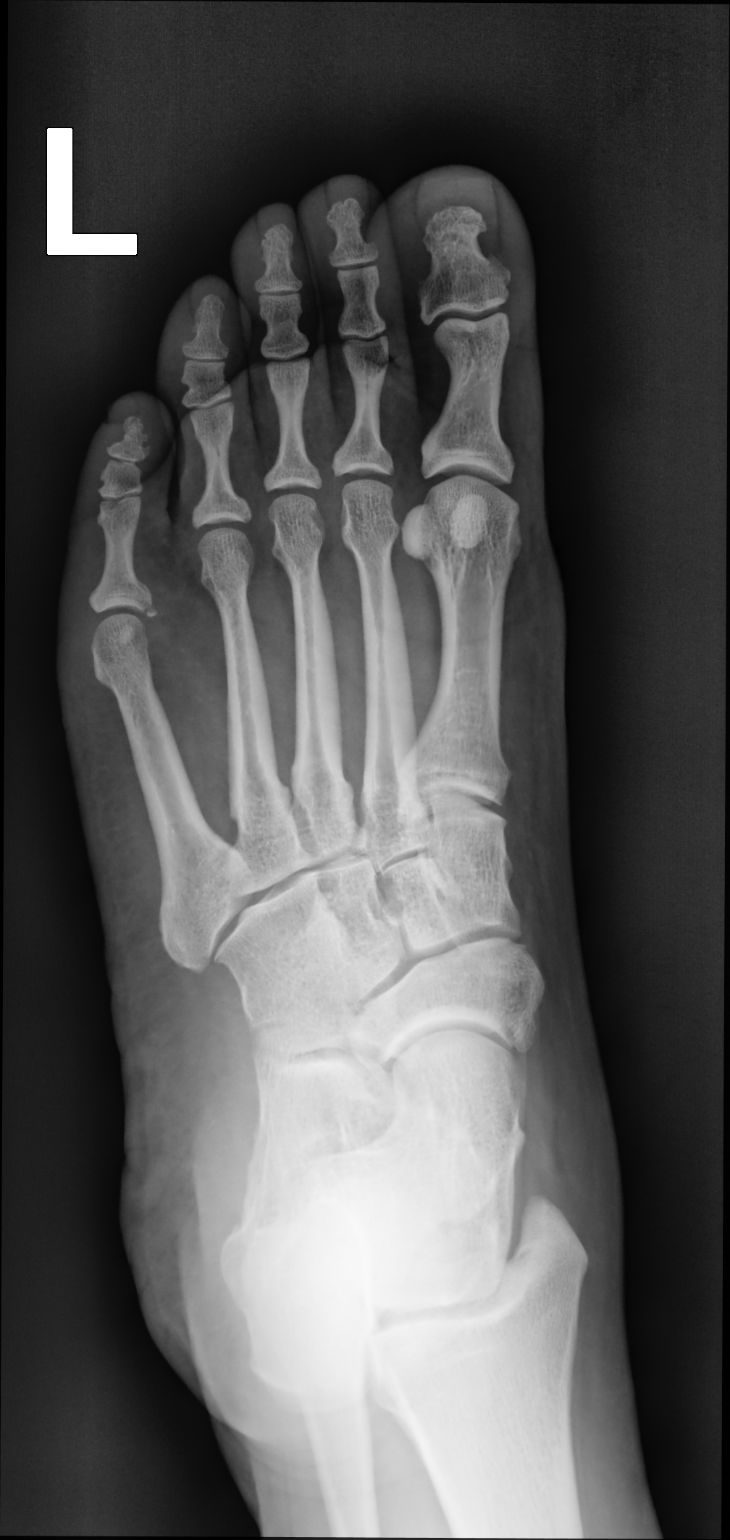

[foot lat]
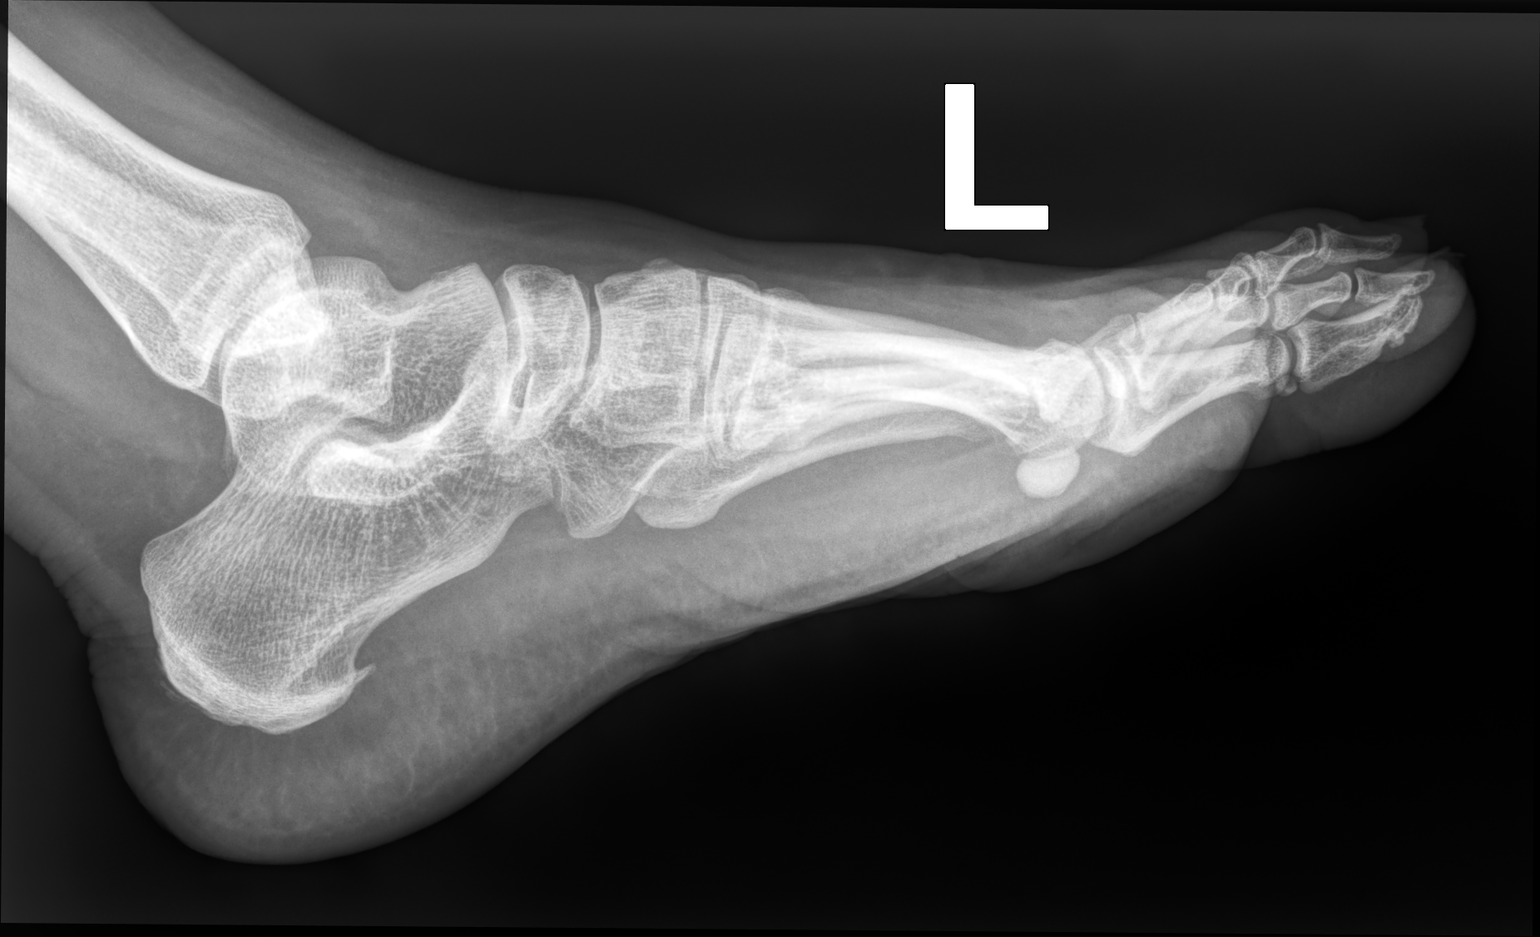

[3 of 3 positions shown; findings below may reference images not displayed]

FINDINGS: Small acute avulsion fracture at the medial base of the fifth
proximal phalanx. No additional fracture. No dislocation. Joint
spaces are preserved. Bone mineralization is normal. Soft tissues
are unremarkable.
IMPRESSION: 1. Small acute avulsion fracture at the medial base of the fifth
proximal phalanx.
